# Patient Record
Sex: Female | Born: 1965 | Race: White | Hispanic: No | Marital: Married | Smoking: Never smoker
Health system: Southern US, Community
[De-identification: ages and names within clinical notes are randomized; demographics above are authoritative.]

## PROBLEM LIST (undated history)

## (undated) DIAGNOSIS — M797 Fibromyalgia: Secondary | ICD-10-CM

## (undated) DIAGNOSIS — I10 Essential (primary) hypertension: Secondary | ICD-10-CM

## (undated) DIAGNOSIS — Z87442 Personal history of urinary calculi: Secondary | ICD-10-CM

## (undated) DIAGNOSIS — K219 Gastro-esophageal reflux disease without esophagitis: Secondary | ICD-10-CM

## (undated) DIAGNOSIS — F419 Anxiety disorder, unspecified: Secondary | ICD-10-CM

## (undated) DIAGNOSIS — G43909 Migraine, unspecified, not intractable, without status migrainosus: Secondary | ICD-10-CM

## (undated) DIAGNOSIS — M1990 Unspecified osteoarthritis, unspecified site: Secondary | ICD-10-CM

## (undated) DIAGNOSIS — Z9889 Other specified postprocedural states: Secondary | ICD-10-CM

## (undated) DIAGNOSIS — M810 Age-related osteoporosis without current pathological fracture: Secondary | ICD-10-CM

## (undated) DIAGNOSIS — E2740 Unspecified adrenocortical insufficiency: Secondary | ICD-10-CM

## (undated) DIAGNOSIS — E7800 Pure hypercholesterolemia, unspecified: Secondary | ICD-10-CM

## (undated) HISTORY — PX: SHOULDER SURGERY: SHX246

## (undated) HISTORY — PX: CHOLECYSTECTOMY: SHX55

## (undated) HISTORY — PX: ENDOMETRIAL ABLATION: SHX621

## (undated) HISTORY — PX: KNEE SURGERY: SHX244

## (undated) HISTORY — PX: OTHER SURGICAL HISTORY: SHX169

## (undated) HISTORY — PX: REFRACTIVE SURGERY: SHX103

---

## 2021-05-02 ENCOUNTER — Encounter (HOSPITAL_BASED_OUTPATIENT_CLINIC_OR_DEPARTMENT_OTHER): Payer: Self-pay

## 2021-05-02 ENCOUNTER — Emergency Department (HOSPITAL_BASED_OUTPATIENT_CLINIC_OR_DEPARTMENT_OTHER)
Admission: EM | Admit: 2021-05-02 | Discharge: 2021-05-02 | Disposition: A | Discharge status: Home / Self Care | Payer: BC Managed Care – PPO | Attending: Emergency Medicine | Admitting: Emergency Medicine

## 2021-05-02 ENCOUNTER — Emergency Department (HOSPITAL_BASED_OUTPATIENT_CLINIC_OR_DEPARTMENT_OTHER): Payer: BC Managed Care – PPO

## 2021-05-02 ENCOUNTER — Other Ambulatory Visit: Payer: Self-pay

## 2021-05-02 DIAGNOSIS — I10 Essential (primary) hypertension: Secondary | ICD-10-CM | POA: Insufficient documentation

## 2021-05-02 DIAGNOSIS — E876 Hypokalemia: Secondary | ICD-10-CM | POA: Diagnosis not present

## 2021-05-02 DIAGNOSIS — R0789 Other chest pain: Secondary | ICD-10-CM | POA: Insufficient documentation

## 2021-05-02 HISTORY — DX: Age-related osteoporosis without current pathological fracture: M81.0

## 2021-05-02 HISTORY — DX: Migraine, unspecified, not intractable, without status migrainosus: G43.909

## 2021-05-02 HISTORY — DX: Essential (primary) hypertension: I10

## 2021-05-02 HISTORY — DX: Pure hypercholesterolemia, unspecified: E78.00

## 2021-05-02 HISTORY — DX: Fibromyalgia: M79.7

## 2021-05-02 LAB — CBC WITH DIFFERENTIAL/PLATELET
Abs Immature Granulocytes: 0.16 10*3/uL — ABNORMAL HIGH (ref 0.00–0.07)
Basophils Absolute: 0.1 10*3/uL (ref 0.0–0.1)
Basophils Relative: 0 %
Eosinophils Absolute: 0.1 10*3/uL (ref 0.0–0.5)
Eosinophils Relative: 0 %
HCT: 40.7 % (ref 36.0–46.0)
Hemoglobin: 13.4 g/dL (ref 12.0–15.0)
Immature Granulocytes: 1 %
Lymphocytes Relative: 19 %
Lymphs Abs: 3.5 10*3/uL (ref 0.7–4.0)
MCH: 29.8 pg (ref 26.0–34.0)
MCHC: 32.9 g/dL (ref 30.0–36.0)
MCV: 90.4 fL (ref 80.0–100.0)
Monocytes Absolute: 1 10*3/uL (ref 0.1–1.0)
Monocytes Relative: 6 %
Neutro Abs: 13.3 10*3/uL — ABNORMAL HIGH (ref 1.7–7.7)
Neutrophils Relative %: 74 %
Platelets: 405 10*3/uL — ABNORMAL HIGH (ref 150–400)
RBC: 4.5 MIL/uL (ref 3.87–5.11)
RDW: 15.6 % — ABNORMAL HIGH (ref 11.5–15.5)
WBC: 18.1 10*3/uL — ABNORMAL HIGH (ref 4.0–10.5)
nRBC: 0 % (ref 0.0–0.2)

## 2021-05-02 LAB — COMPREHENSIVE METABOLIC PANEL
ALT: 15 U/L (ref 0–44)
AST: 15 U/L (ref 15–41)
Albumin: 3.7 g/dL (ref 3.5–5.0)
Alkaline Phosphatase: 95 U/L (ref 38–126)
Anion gap: 10 (ref 5–15)
BUN: 11 mg/dL (ref 6–20)
CO2: 32 mmol/L (ref 22–32)
Calcium: 9.4 mg/dL (ref 8.9–10.3)
Chloride: 95 mmol/L — ABNORMAL LOW (ref 98–111)
Creatinine, Ser: 0.85 mg/dL (ref 0.44–1.00)
GFR, Estimated: >60 mL/min (ref >60)
Glucose, Bld: 167 mg/dL — ABNORMAL HIGH (ref 70–99)
Potassium: 3.1 mmol/L — ABNORMAL LOW (ref 3.5–5.1)
Sodium: 137 mmol/L (ref 135–145)
Total Bilirubin: 0.3 mg/dL (ref 0.3–1.2)
Total Protein: 7.2 g/dL (ref 6.5–8.1)

## 2021-05-02 LAB — LIPASE, BLOOD: Lipase: 30 U/L (ref 11–51)

## 2021-05-02 LAB — TROPONIN I (HIGH SENSITIVITY)
Troponin I (High Sensitivity): 2 ng/L (ref <18)
Troponin I (High Sensitivity): 2 ng/L (ref <18)

## 2021-05-02 MED ORDER — POTASSIUM CHLORIDE CRYS ER 20 MEQ PO TBCR
40.0000 meq | EXTENDED_RELEASE_TABLET | Freq: Once | ORAL | Status: AC
  Administered 2021-05-02: 40 meq via ORAL
  Filled 2021-05-02: qty 2

## 2021-05-02 MED ORDER — POTASSIUM CHLORIDE ER 20 MEQ PO TBCR: 20.0000 meq | EXTENDED_RELEASE_TABLET | Freq: Every day | ORAL | 0 refills | Status: DC

## 2021-05-02 NOTE — ED Provider Notes (Signed)
[facility] EMERGENCY DEPARTMENT Provider Note   CSN: [id number] Arrival date & time: 05/02/21  1336     History Chief Complaint  Patient presents with  . Chest Pain    Tracey Clarke is a 55 y.o. female with a past medical history of hypertension, high cholesterol, migraines, fibromyalgia, adrenal insufficiency, who presents today for evaluation of chest pain. She states that since yesterday at about noon she has had 3 episodes of sharp bad chest pain with a constant chest pressure since then. She states that the sharp bed chest pain that she has has started all 3 times while she was seated at her desk, she denies any physical exertion or strong emotions prior to this. She states that her pain lasts about 5 to 10 minutes.  No aggravating or alleviating factors noted.  She feels short of breath during this pain.  She denies any diaphoresis, nausea, or vomiting. She did not eat right before this pain started at. She denies any recent surgeries or immobilizations.  No hemoptysis. She denies any history of DVT/PE. She does report that her father had his first MI at the age of 54 and died in his 30s from heart disease, and that he had about 7 siblings and all of them have had some sort of heart issue. She denies any tobacco use.  No fevers, recent sick contacts.  The first episode of chest pain that she had started in her chest and radiated into her back.  The second 1 started in her back and then radiated into her chest.  HPI     Past Medical History:  Diagnosis Date  . Fibromyalgia   . High cholesterol   . Hypertension   . Migraine   . Osteoporosis     There are no problems to display for this patient.   Past Surgical History:  Procedure Laterality Date  . ENDOMETRIAL ABLATION    . KNEE SURGERY    . SHOULDER SURGERY       OB History   No obstetric history on file.     History reviewed. No pertinent family history.  Social History   Tobacco Use  .  Smoking status: Never Smoker  . Smokeless tobacco: Never Used  Vaping Use  . Vaping Use: Never used  Substance Use Topics  . Alcohol use: Never  . Drug use: Never    Home Medications Prior to Admission medications   Medication Sig Start Date End Date Taking? Authorizing Provider  potassium chloride 20 MEQ TBCR Take 20 mEq by mouth daily for 5 days. 05/02/21 05/07/21 Yes Cristina Gong, PA-C    Allergies    Codeine, Doxycycline, and Lyrica [pregabalin]  Review of Systems   Review of Systems  Constitutional: Negative for chills, fatigue and fever.  HENT: Negative for congestion.   Eyes: Negative for visual disturbance.  Respiratory: Positive for shortness of breath (When pain is severe).   Cardiovascular: Positive for chest pain. Negative for palpitations and leg swelling.  Gastrointestinal: Negative for abdominal pain, diarrhea, nausea and vomiting.  Genitourinary: Negative for dysuria.  Musculoskeletal: Positive for back pain (Also has chronic back pain).  Skin: Negative for color change, rash and wound.  Neurological: Positive for headaches (Has migraines). Negative for weakness.  Psychiatric/Behavioral: Negative for confusion.  All other systems reviewed and are negative.   Physical Exam Updated Vital Signs BP 139/68 (BP Location: Right Arm)   Pulse 74   Temp 98.4 F (36.9 C) (Oral)  Resp 16   Ht 5\' 3"  (1.6 m)   Wt 93.9 kg   SpO2 99%   BMI 36.67 kg/m   Physical Exam  ED Results / Procedures / Treatments   Labs (all labs ordered are listed, but only abnormal results are displayed) Labs Reviewed  COMPREHENSIVE METABOLIC PANEL - Abnormal; Notable for the following components:      Result Value   Potassium 3.1 (*)    Chloride 95 (*)    Glucose, Bld 167 (*)    All other components within normal limits  CBC WITH DIFFERENTIAL/PLATELET - Abnormal; Notable for the following components:   WBC 18.1 (*)    RDW 15.6 (*)    Platelets 405 (*)    Neutro Abs 13.3  (*)    Abs Immature Granulocytes 0.16 (*)    All other components within normal limits  LIPASE, BLOOD  TROPONIN I (HIGH SENSITIVITY)  TROPONIN I (HIGH SENSITIVITY)    EKG EKG Interpretation  Date/Time:  Friday May 02 2021 13:49:07 EDT Ventricular Rate:  73 PR Interval:  142 QRS Duration: 86 QT Interval:  416 QTC Calculation: 458 R Axis:   117 Text Interpretation: Normal sinus rhythm Left posterior fascicular block Abnormal ECG No previous ECGs available Confirmed by 12-08-1992 (Alvira Monday) on 05/02/2021 1:59:10 PM   Radiology DG Chest 2 View  Result Date: 05/02/2021 CLINICAL DATA:  Chest pain and shortness of breath beginning yesterday EXAM: CHEST - 2 VIEW COMPARISON:  08/20/2016 FINDINGS: Heart and mediastinal shadows are normal. The lungs are clear. The vascularity is normal. No effusions. Previous right shoulder replacement. Mild spinal degenerative changes. IMPRESSION: No active cardiopulmonary disease. Electronically Signed   By: 08/22/2016 M.D.   On: 05/02/2021 14:44    Procedures Procedures   Medications Ordered in ED Medications  potassium chloride SA (KLOR-CON) CR tablet 40 mEq (40 mEq Oral Given 05/02/21 1517)    ED Course  I have reviewed the triage vital signs and the nursing notes.  Pertinent labs & imaging results that were available during my care of the patient were reviewed by me and considered in my medical decision making (see chart for details).  Clinical Course as of 05/03/21 0049  Fri May 02, 2021  1412 I attempted to see patient, she is going to x-ray will return.  [EH]  1506 WBC(!): 18.1 Three months ago was in the 10s.  Patient takes steroids daily due to adrenal insufficiency, suspect this may be contributing.  [EH]  1511 Patient reevaluated, we discussed results. She did have a steroid shot on Tuesday which would explain the leukocytosis.  She does not have any other infectious symptoms. [EH]    Clinical Course User Index [EH] Friday   MDM Rules/Calculators/A&P                         Patient is a 55 year old woman who presents today for evaluation of 3 episodes of chest pain.  These occurred at rest, lasted 5 to 10 minutes.  They were not associated with paresis, nausea or vomiting.  Troponin x2 is negative.  Here patient is afebrile, not hypoxic, tachycardic or tachypneic.  Her white count is slightly elevated at 18, however she did get a steroid shot on Tuesday and takes chronic daily steroids for adrenal insufficiency.  Chest x-ray is unremarkable.  She does have mild hypokalemia.  I suspect this is secondary to her hydrochlorothiazide and Lasix use.  This is  replaced here, and she is given a prescription for 5 more days at home with recommendations to follow-up with PCP.  Her EKG was nonischemic. Patient's heart score is 4 putting her in the moderate risk category appropriate for either admission versus close outpatient follow-up. Discussed these options at length with the patient and her family member in room. Given 2 negative troponins, the ability to exacerbate and recreate her pain with chest palpation we discussed admission versus discharge with close outpatient follow-up.  We discussed risks, benefits, noncompliance to these decisions. Through shared decision making patient wished for close outpatient follow-up. She is given cardiology follow-up. We discussed that if her chest pain returns, she develops any new symptoms, or has any other concerns she should not hesitate to seek additional medical care and evaluation. She states her understanding and is agreeable with this plan.  Return precautions were discussed with patient who states their understanding.  At the time of discharge patient denied any unaddressed complaints or concerns.  Patient is agreeable for discharge home.  Note: Portions of this report may have been transcribed using voice recognition software. Every effort was made to ensure  accuracy; however, inadvertent computerized transcription errors may be present   Final Clinical Impression(s) / ED Diagnoses Final diagnoses:  Atypical chest pain  Chest wall pain  Hypokalemia    Rx / DC Orders ED Discharge Orders         Ordered    potassium chloride 20 MEQ TBCR  Daily        05/02/21 1724           Cristina Gong, Cordelia Poche 05/03/21 0052    Virgina Norfolk, DO 05/03/21 1824

## 2021-05-02 NOTE — ED Triage Notes (Addendum)
Pt c/o CP started yesterday-denies fever/flu sx-NAD-steady gait 

## 2021-05-02 NOTE — Discharge Instructions (Addendum)
As we discussed today your potassium was low. This is most likely due to your Lasix and hydrochlorothiazide. I have given you a prescription for a few days of potassium to help replete your potassium. I would recommend following up with your primary care doctor to get this rechecked after you finish this supplement.  Please schedule the appointment now.  It may be that you need to be on potassium every day to help keep your levels normal.  I suspect that at least some of your chest pain is related to pain in the chest wall as your chest wall hurt when I pressed on it.   If your chest pain returns, you have any shortness of breath, fevers, pain in your left-sided neck or arm that is abnormal, you develop vomiting, nausea, cold sweats, or have any other concerns please do not hesitate to seek additional medical care and evaluation.  Please schedule a follow-up appointment with cardiology.  Please call them on Monday to set up an appointment.  Today we discussed options for admission versus discharge home with outpatient follow-up. Through shared decision making after we discussed risks, benefits, and alternatives decided on close outpatient follow-up.

## 2022-03-24 ENCOUNTER — Other Ambulatory Visit (HOSPITAL_BASED_OUTPATIENT_CLINIC_OR_DEPARTMENT_OTHER): Payer: Self-pay

## 2022-03-24 MED ORDER — SOLU-CORTEF 100 MG IJ SOLR
INTRAMUSCULAR | 1 refills | Status: DC
  Filled 2022-03-24: qty 4, 2d supply, fill #0
  Filled 2022-03-24: qty 2, 1d supply, fill #0

## 2022-06-21 IMAGING — CR DG CHEST 2V
2 series · 2 of 2 positions shown · non-contrast
Comparison: 08/20/2016

CLINICAL DATA: Chest pain and shortness of breath beginning
yesterday

EXAM:
CHEST - 2 VIEW

[w chest pa]
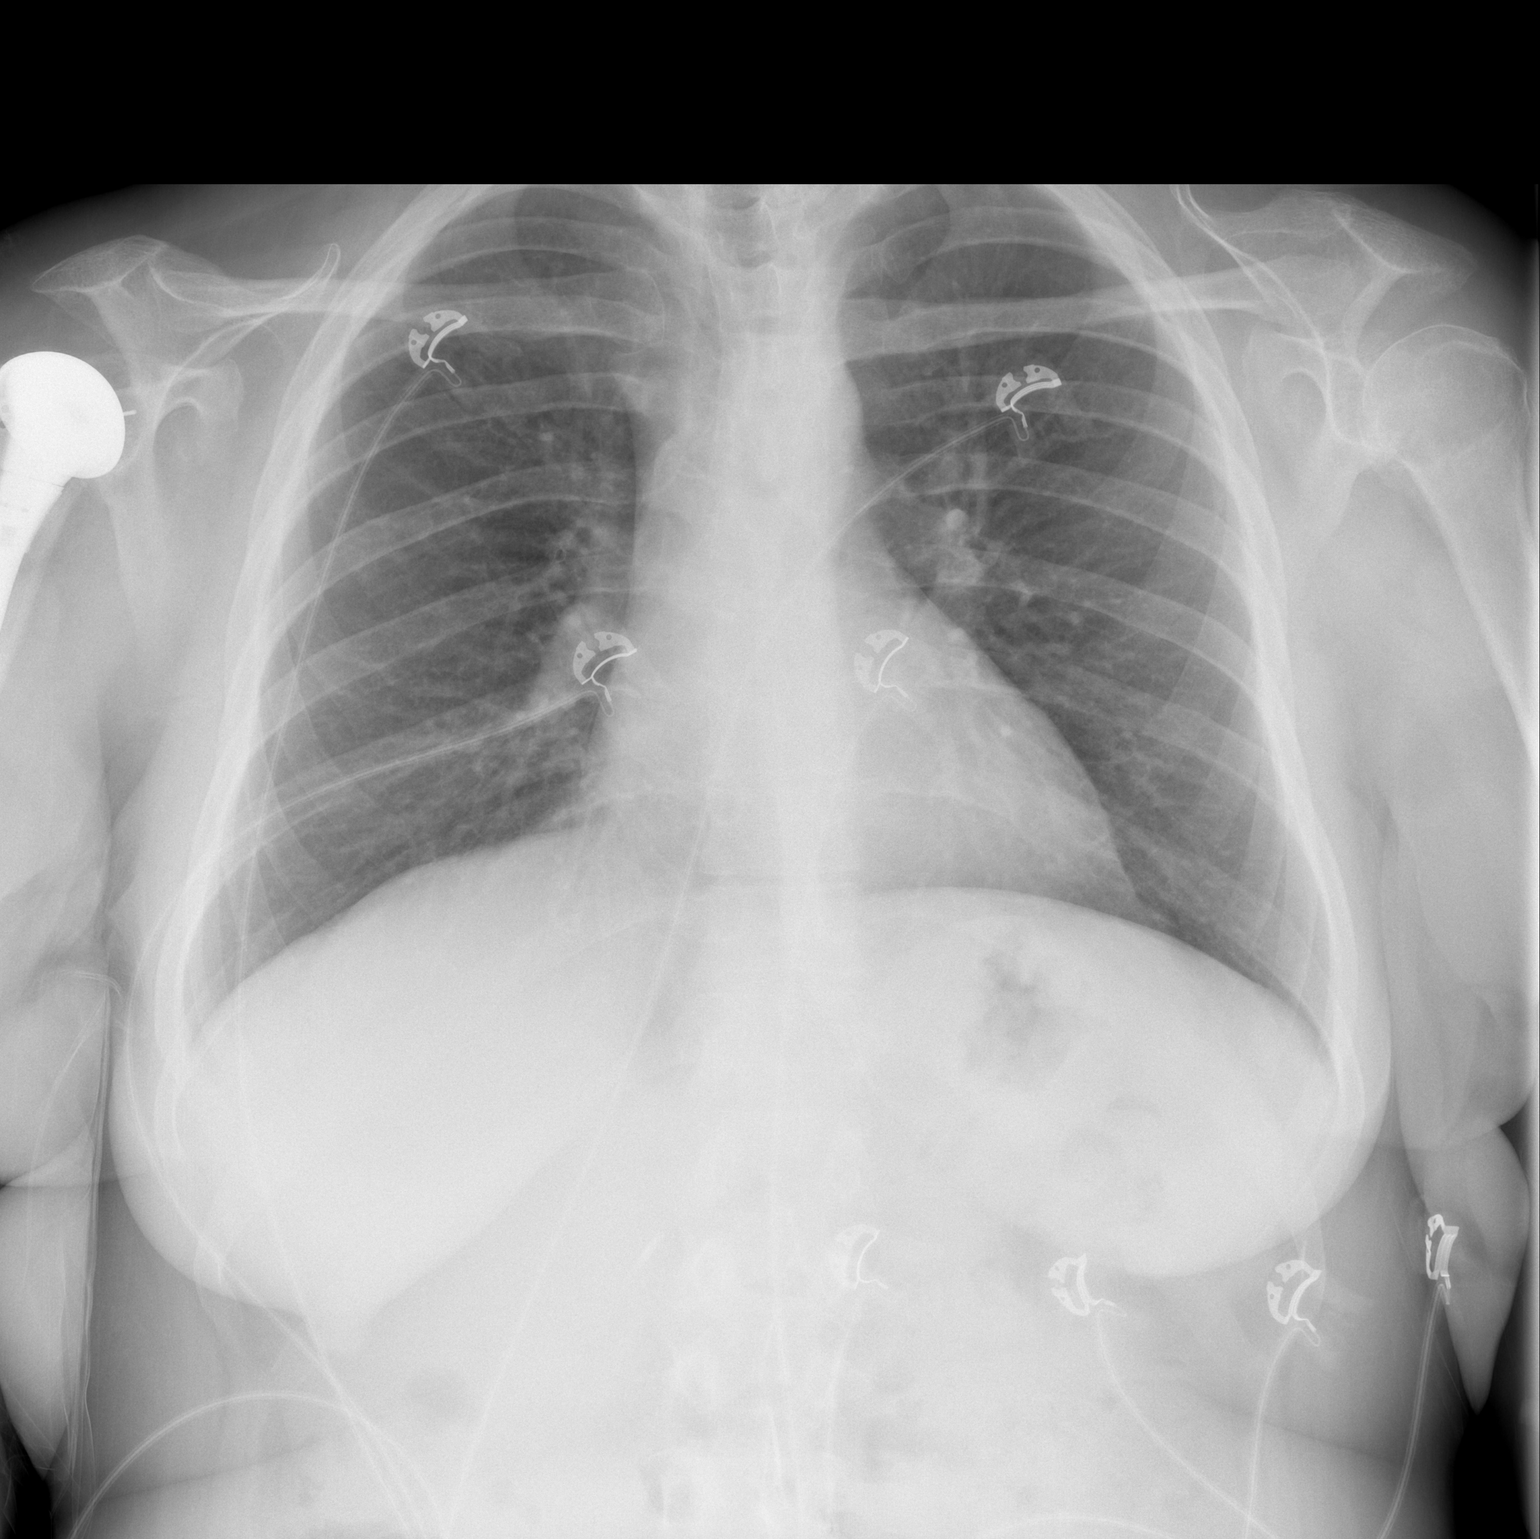

[w chest lat]
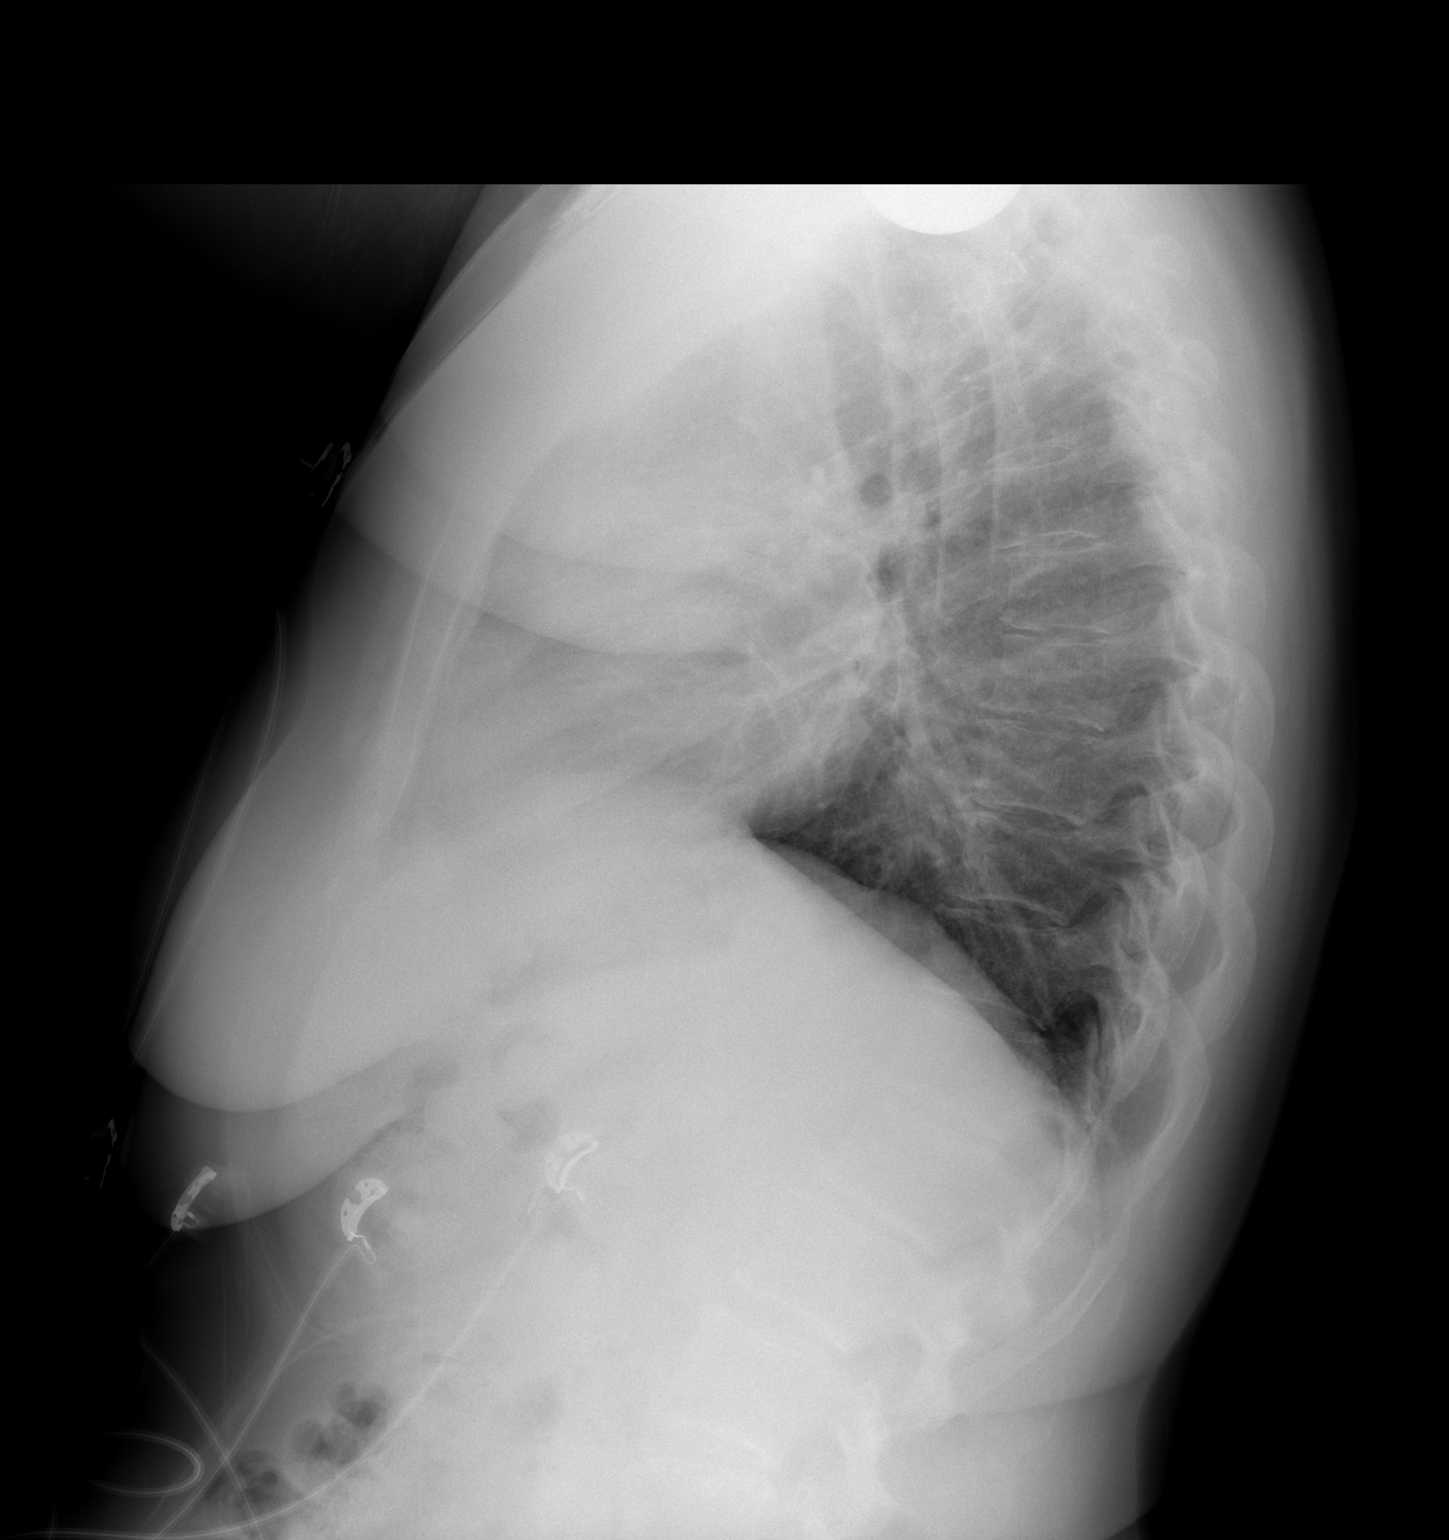

[2 of 2 positions shown; findings below may reference images not displayed]

FINDINGS: Heart and mediastinal shadows are normal. The lungs are clear. The
vascularity is normal. No effusions. Previous right shoulder
replacement. Mild spinal degenerative changes.
IMPRESSION: No active cardiopulmonary disease.

## 2023-07-23 ENCOUNTER — Other Ambulatory Visit (HOSPITAL_COMMUNITY): Payer: Self-pay | Admitting: Orthopedic Surgery

## 2023-07-23 DIAGNOSIS — Z96612 Presence of left artificial shoulder joint: Secondary | ICD-10-CM

## 2023-07-28 ENCOUNTER — Encounter (HOSPITAL_COMMUNITY)
Admission: RE | Admit: 2023-07-28 | Discharge: 2023-07-28 | Disposition: A | Discharge status: Home / Self Care | Payer: BC Managed Care – PPO | Source: Ambulatory Visit | Attending: Orthopedic Surgery | Admitting: Orthopedic Surgery

## 2023-07-28 DIAGNOSIS — Z96612 Presence of left artificial shoulder joint: Secondary | ICD-10-CM | POA: Insufficient documentation

## 2023-07-28 MED ORDER — TECHNETIUM TC 99M MEDRONATE IV KIT
21.0000 | PACK | Freq: Once | INTRAVENOUS | Status: AC | PRN
  Administered 2023-07-28: 21 via INTRAVENOUS

## 2023-08-17 NOTE — Progress Notes (Signed)
Surgery orders requested via Epic inbox.

## 2023-08-19 NOTE — Progress Notes (Addendum)
Anesthesia Review:  PCP: [facility]- LOV 05/12/23  Cardiologist :  DR Uvaldo Rising Timsina LOV 07/19/23  Endo- DR Mayford Knife- 05/17/2023  Neuro- DR Antonietta Barcelona- LOV 07/13/23  Chest x-ray : EKG : [facility]/24[facility] - have requested by fax  On chart  Followed by cardiologist due to father deceased at age 57 due to heart  Echo : Stress test: Cardiac Cath :  Activity level: can do a flgiht of stairs without difficulty  Sleep Study/ CPAP : none Fasting Blood Sugar :      / Checks Blood Sugar -- times a day:   Blood Thinner/ Instructions /Last Dose: ASA / Instructions/ Last Dose :   81 mg aspiirin    Ozempic - last dose on 08/19/2023 - takes for weight loss    PT has adrenal fatigue ( adrenal insufficiency)  per pt .  Endocrinologist- DR Rueben Bash - per pt at preop MD would like for pt to receive preop, periop and post hydrocortison IV per pt.  Instructed pt to have DR Mayford Knife send to Dr Rennis Chris their recommendations for Hydrocortisone and we would receive those instructions.  PT voiced understanding.  Husband with pt at time of preop appt.  Instructed pt to take po cortef am of surgery. Jacobo Forest aware.

## 2023-08-19 NOTE — Patient Instructions (Signed)
SURGICAL WAITING ROOM VISITATION  Patients having surgery or a procedure may have no more than 2 support people in the waiting area - these visitors may rotate.    Children under the age of 5 must have an adult with them who is not the patient.  Due to an increase in RSV and influenza rates and associated hospitalizations, children ages 50 and under may not visit patients in [facility] hospitals.  If the patient needs to stay at the hospital during part of their recovery, the visitor guidelines for inpatient rooms apply. Pre-op nurse will coordinate an appropriate time for 1 support person to accompany patient in pre-op.  This support person may not rotate.    Please refer to the [facility] website for the visitor guidelines for Inpatients (after your surgery is over and you are in a regular room).       Your procedure is scheduled on:  09/02/2023    Report to [facility] Main Entrance    Report to admitting at   [phone]   Call this number if you have problems the morning of surgery [phone]   Do not eat food :After Midnight.   After Midnight you may have the following liquids until _ 0430_____ AM DAY OF SURGERY  Water Non-Citrus Juices (without pulp, NO RED-Apple, White grape, White cranberry) Black Coffee (NO MILK/CREAM OR CREAMERS, sugar ok)  Clear Tea (NO MILK/CREAM OR CREAMERS, sugar ok) regular and decaf                             Plain Jell-O (NO RED)                                           Fruit ices (not with fruit pulp, NO RED)                                     Popsicles (NO RED)                                                               Sports drinks like Gatorade (NO RED)                   The day of surgery:  Drink ONE (1) Pre-Surgery Clear Ensure or G2 at  0430 AM  ( have completed by )  the morning of surgery. Drink in one sitting. Do not sip.  This drink was given to you during your hospital  pre-op appointment visit. Nothing else to  drink after completing the  Pre-Surgery Clear Ensure or G2.          If you have questions, please contact your surgeon's office.      Oral Hygiene is also important to reduce your risk of infection.                                    Remember - BRUSH YOUR TEETH THE MORNING OF SURGERY WITH YOUR  REGULAR TOOTHPASTE  DENTURES WILL BE REMOVED PRIOR TO SURGERY PLEASE DO NOT APPLY "Poly grip" OR ADHESIVES!!!   Do NOT smoke after Midnight   Stop all vitamins and herbal supplements 7 days before surgery.   Take these medicines the morning of surgery with A SIP OF WATER: gabapentin, omeprazole, zoloft   DO NOT TAKE ANY ORAL DIABETIC MEDICATIONS DAY OF YOUR SURGERY  Bring CPAP mask and tubing day of surgery.                              You may not have any metal on your body including hair pins, jewelry, and body piercing             Do not wear make-up, lotions, powders, perfumes/cologne, or deodorant  Do not wear nail polish including gel and S&S, artificial/acrylic nails, or any other type of covering on natural nails including finger and toenails. If you have artificial nails, gel coating, etc. that needs to be removed by a nail salon please have this removed prior to surgery or surgery may need to be canceled/ delayed if the surgeon/ anesthesia feels like they are unable to be safely monitored.   Do not shave  48 hours prior to surgery.               Men may shave face and neck.   Do not bring valuables to the hospital. [facility] IS NOT             RESPONSIBLE   FOR VALUABLES.   Contacts, glasses, dentures or bridgework may not be worn into surgery.   Bring small overnight bag day of surgery.   DO NOT BRING YOUR HOME MEDICATIONS TO THE HOSPITAL. PHARMACY WILL DISPENSE MEDICATIONS LISTED ON YOUR MEDICATION LIST TO YOU DURING YOUR ADMISSION IN THE HOSPITAL!    Patients discharged on the day of surgery will not be allowed to drive home.  Someone NEEDS to stay with you for the  first 24 hours after anesthesia.   Special Instructions: Bring a copy of your healthcare power of attorney and living will documents the day of surgery if you haven't scanned them before.              Please read over the following fact sheets you were given: IF YOU HAVE QUESTIONS ABOUT YOUR PRE-OP INSTRUCTIONS PLEASE CALL [phone]   If you received a COVID test during your pre-op visit  it is requested that you wear a mask when out in public, stay away from anyone that may not be feeling well and notify your surgeon if you develop symptoms. If you test positive for Covid or have been in contact with anyone that has tested positive in the last 10 days please notify you surgeon.      Pre-operative 5 CHG Bath Instructions   You can play a key role in reducing the risk of infection after surgery. Your skin needs to be as free of germs as possible. You can reduce the number of germs on your skin by washing with CHG (chlorhexidine gluconate) soap before surgery. CHG is an antiseptic soap that kills germs and continues to kill germs even after washing.   DO NOT use if you have an allergy to chlorhexidine/CHG or antibacterial soaps. If your skin becomes reddened or irritated, stop using the CHG and notify one of our RNs at [phone].   Please shower with the CHG soap starting  4 days before surgery using the following schedule:     Please keep in mind the following:  DO NOT shave, including legs and underarms, starting the day of your first shower.   You may shave your face at any point before/day of surgery.  Place clean sheets on your bed the day you start using CHG soap. Use a clean washcloth (not used since being washed) for each shower. DO NOT sleep with pets once you start using the CHG.   CHG Shower Instructions:  If you choose to wash your hair and private area, wash first with your normal shampoo/soap.  After you use shampoo/soap, rinse your hair and body thoroughly to remove  shampoo/soap residue.  Turn the water OFF and apply about 3 tablespoons (45 ml) of CHG soap to a CLEAN washcloth.  Apply CHG soap ONLY FROM YOUR NECK DOWN TO YOUR TOES (washing for 3-5 minutes)  DO NOT use CHG soap on face, private areas, open wounds, or sores.  Pay special attention to the area where your surgery is being performed.  If you are having back surgery, having someone wash your back for you may be helpful. Wait 2 minutes after CHG soap is applied, then you may rinse off the CHG soap.  Pat dry with a clean towel  Put on clean clothes/pajamas   If you choose to wear lotion, please use ONLY the CHG-compatible lotions on the back of this paper.     Additional instructions for the day of surgery: DO NOT APPLY any lotions, deodorants, cologne, or perfumes.   Put on clean/comfortable clothes.  Brush your teeth.  Ask your nurse before applying any prescription medications to the skin.      CHG Compatible Lotions   Aveeno Moisturizing lotion  Cetaphil Moisturizing Cream  Cetaphil Moisturizing Lotion  Clairol Herbal Essence Moisturizing Lotion, Dry Skin  Clairol Herbal Essence Moisturizing Lotion, Extra Dry Skin  Clairol Herbal Essence Moisturizing Lotion, Normal Skin  Curel Age Defying Therapeutic Moisturizing Lotion with Alpha Hydroxy  Curel Extreme Care Body Lotion  Curel Soothing Hands Moisturizing Hand Lotion  Curel Therapeutic Moisturizing Cream, Fragrance-Free  Curel Therapeutic Moisturizing Lotion, Fragrance-Free  Curel Therapeutic Moisturizing Lotion, Original Formula  Eucerin Daily Replenishing Lotion  Eucerin Dry Skin Therapy Plus Alpha Hydroxy Crme  Eucerin Dry Skin Therapy Plus Alpha Hydroxy Lotion  Eucerin Original Crme  Eucerin Original Lotion  Eucerin Plus Crme Eucerin Plus Lotion  Eucerin TriLipid Replenishing Lotion  Keri Anti-Bacterial Hand Lotion  Keri Deep Conditioning Original Lotion Dry Skin Formula Softly Scented  Keri Deep Conditioning  Original Lotion, Fragrance Free Sensitive Skin Formula  Keri Lotion Fast Absorbing Fragrance Free Sensitive Skin Formula  Keri Lotion Fast Absorbing Softly Scented Dry Skin Formula  Keri Original Lotion  Keri Skin Renewal Lotion Keri Silky Smooth Lotion  Keri Silky Smooth Sensitive Skin Lotion  Nivea Body Creamy Conditioning Oil  Nivea Body Extra Enriched Lotion  Nivea Body Original Lotion  Nivea Body Sheer Moisturizing Lotion Nivea Crme  Nivea Skin Firming Lotion  NutraDerm 30 Skin Lotion  NutraDerm Skin Lotion  NutraDerm Therapeutic Skin Cream  NutraDerm Therapeutic Skin Lotion  ProShield Protective Hand Cream  Provon moisturizing lotion  [place]- Preparing for Total Shoulder Arthroplasty    Before surgery, you can play an important role. Because skin is not sterile, your skin needs to be as free of germs as possible. You can reduce the number of germs on your skin by using the following products. Benzoyl Peroxide Gel  Reduces the number of germs present on the skin Applied twice a day to shoulder area starting two days before surgery    ==================================================================  Please follow these instructions carefully:  BENZOYL PEROXIDE 5% GEL  Please do not use if you have an allergy to benzoyl peroxide.   If your skin becomes reddened/irritated stop using the benzoyl peroxide.  Starting two days before surgery, apply as follows: Apply benzoyl peroxide in the morning and at night. Apply after taking a shower. If you are not taking a shower clean entire shoulder front, back, and side along with the armpit with a clean wet washcloth.  Place a quarter-sized dollop on your shoulder and rub in thoroughly, making sure to cover the front, back, and side of your shoulder, along with the armpit.   2 days before ____ AM   ____ PM              1 day before ____ AM   ____ PM                         Do this twice a day for two days.  (Last application is  the night before surgery, AFTER using the CHG soap as described below).  Do NOT apply benzoyl peroxide gel on the day of surgery.

## 2023-08-24 ENCOUNTER — Encounter (HOSPITAL_COMMUNITY)
Admission: RE | Admit: 2023-08-24 | Discharge: 2023-08-24 | Disposition: A | Discharge status: Home / Self Care | Payer: BC Managed Care – PPO | Source: Ambulatory Visit | Attending: Orthopedic Surgery | Admitting: Orthopedic Surgery

## 2023-08-24 ENCOUNTER — Other Ambulatory Visit: Payer: Self-pay

## 2023-08-24 ENCOUNTER — Encounter (HOSPITAL_COMMUNITY): Payer: Self-pay

## 2023-08-24 VITALS — BP 131/82 | HR 62 | Temp 98.5°F | Resp 16 | Ht 63.0 in | Wt 172.0 lb

## 2023-08-24 DIAGNOSIS — Z01818 Encounter for other preprocedural examination: Secondary | ICD-10-CM | POA: Diagnosis present

## 2023-08-24 HISTORY — DX: Unspecified osteoarthritis, unspecified site: M19.90

## 2023-08-24 HISTORY — DX: Gastro-esophageal reflux disease without esophagitis: K21.9

## 2023-08-24 HISTORY — DX: Unspecified adrenocortical insufficiency: E27.40

## 2023-08-24 HISTORY — DX: Other specified postprocedural states: Z98.890

## 2023-08-24 HISTORY — DX: Anxiety disorder, unspecified: F41.9

## 2023-08-24 HISTORY — DX: Personal history of urinary calculi: Z87.442

## 2023-08-24 LAB — BASIC METABOLIC PANEL
Anion gap: 10 (ref 5–15)
BUN: 7 mg/dL (ref 6–20)
CO2: 27 mmol/L (ref 22–32)
Calcium: 9.8 mg/dL (ref 8.9–10.3)
Chloride: 96 mmol/L — ABNORMAL LOW (ref 98–111)
Creatinine, Ser: 0.76 mg/dL (ref 0.44–1.00)
GFR, Estimated: >60 mL/min (ref >60)
Glucose, Bld: 85 mg/dL (ref 70–99)
Potassium: 3.5 mmol/L (ref 3.5–5.1)
Sodium: 133 mmol/L — ABNORMAL LOW (ref 135–145)

## 2023-08-24 LAB — CBC
HCT: 42 % (ref 36.0–46.0)
Hemoglobin: 13.9 g/dL (ref 12.0–15.0)
MCH: 30.3 pg (ref 26.0–34.0)
MCHC: 33.1 g/dL (ref 30.0–36.0)
MCV: 91.5 fL (ref 80.0–100.0)
Platelets: 285 10*3/uL (ref 150–400)
RBC: 4.59 MIL/uL (ref 3.87–5.11)
RDW: 13.9 % (ref 11.5–15.5)
WBC: 9.6 10*3/uL (ref 4.0–10.5)
nRBC: 0 % (ref 0.0–0.2)

## 2023-08-24 LAB — SURGICAL PCR SCREEN
MRSA, PCR: NEGATIVE
Staphylococcus aureus: NEGATIVE

## 2023-08-25 NOTE — Progress Notes (Addendum)
Anesthesia Chart Review   Case: [id number] Date/Time: 09/02/23 0715   Procedure: Removal Left Anatomic shoulder arthroplasty and conversion to Left Reverse shoulder arthroplasty (Left: Shoulder)   Anesthesia type: General   Pre-op diagnosis: Unstable Left total shoulder arthroplasty   Location: [facility] / [facility]   Surgeons: Tracey Hanly, MD       DISCUSSION:57 y.o. never smoker with h/o PONV, adrenal insufficiency, unstable left shoulder arthroplasty scheduled for above procedure 09/02/2023 with Dr. Francena Clarke.   Pt with adrenal insufficiency.  Pt takes hydrocortisone 10mg  BID. Stable at last endocrinology visit 05/17/2023. Spoke with endocrine, recommendations outlined below.    Pt advised to double her hydrocortisone dose the day before surgery, hold DOS, and resume 10mg  BID three days after surgery.    Recommend 50mg  IV hydrocortisone intra-op.     Pt last seen by cardiology 07/19/2023. Per OV note echo ordered.  Pending results.   Low risk stress test 01/10/2016.   Echo 08/31/2023 with EF 60-65%, no valvular problems.  Results on chart.  VS: BP 131/82   Pulse 62   Temp 36.9 C (Oral)   Resp 16   Ht 5\' 3"  (1.6 m)   Wt 78 kg   SpO2 100%   BMI 30.47 kg/m   PROVIDERS: [employer], NP   LABS: Labs reviewed: Acceptable for surgery. (all labs ordered are listed, but only abnormal results are displayed)  Labs Reviewed  BASIC METABOLIC PANEL - Abnormal; Notable for the following components:      Result Value   Sodium 133 (*)    Chloride 96 (*)    All other components within normal limits  SURGICAL PCR SCREEN  CBC     IMAGES:   EKG:   CV:  Past Medical History:  Diagnosis Date   Adrenal insufficiency (HCC)    adrenal fatigue per pt   Anxiety    Arthritis    Fibromyalgia    GERD (gastroesophageal reflux disease)    High cholesterol    History of kidney stones    Hypertension    no meds in 10 years per pt   Migraine    Osteoporosis    PONV  (postoperative nausea and vomiting)     Past Surgical History:  Procedure Laterality Date   burr hole surgery on brain      CHOLECYSTECTOMY     ENDOMETRIAL ABLATION     KNEE SURGERY     REFRACTIVE SURGERY     right wrist arthroscopic surgery      SHOULDER SURGERY     umbilical hernia surgery       MEDICATIONS:  acetaminophen (TYLENOL) 650 MG CR tablet   ARIPiprazole (ABILIFY) 5 MG tablet   Ascorbic Acid (VITAMIN C) 1000 MG tablet   aspirin EC 81 MG tablet   atenolol (TENORMIN) 50 MG tablet   Cholecalciferol (VITAMIN D3) 125 MCG (5000 UT) CAPS   Coenzyme Q10 (COQ10) 100 MG CAPS   diphenhydramine-acetaminophen (TYLENOL PM) 25-500 MG TABS tablet   DULoxetine (CYMBALTA) 60 MG capsule   furosemide (LASIX) 20 MG tablet   gabapentin (NEURONTIN) 800 MG tablet   hydrochlorothiazide (HYDRODIURIL) 12.5 MG tablet   hydrocortisone (CORTEF) 10 MG tablet   LIPITOR 80 MG tablet   LORazepam (ATIVAN) 0.5 MG tablet   meloxicam (MOBIC) 15 MG tablet   omeprazole (PRILOSEC) 40 MG capsule   OZEMPIC, 1 MG/DOSE, 4 MG/3ML SOPN   QULIPTA 60 MG TABS   sertraline (ZOLOFT) 100 MG tablet  tiZANidine (ZANAFLEX) 4 MG tablet   traZODone (DESYREL) 50 MG tablet   ZINC PICOLINATE PO   zolpidem (AMBIEN) 10 MG tablet   No current facility-administered medications for this encounter.     Tracey Cipro Ward, PA-C [facility] [phone]

## 2023-09-01 NOTE — Anesthesia Preprocedure Evaluation (Addendum)
Anesthesia Evaluation  Patient identified by MRN, date of birth, ID band Patient awake    Reviewed: Allergy & Precautions, NPO status , Patient's Chart, lab work & pertinent test results, reviewed documented beta blocker date and time   History of Anesthesia Complications Negative for: history of anesthetic complications  Airway Mallampati: II  TM Distance: >3 FB Neck ROM: Full    Dental no notable dental hx.    Pulmonary neg pulmonary ROS   Pulmonary exam normal        Cardiovascular hypertension, Pt. on medications and Pt. on home beta blockers Normal cardiovascular exam     Neuro/Psych  Headaches  Anxiety        GI/Hepatic Neg liver ROS,GERD  Controlled,,  Endo/Other  Adrenal insufficiency, takes hydrocortisone 10mg  BID  Renal/GU negative Renal ROS  negative genitourinary   Musculoskeletal  (+) Arthritis ,  Fibromyalgia -  Abdominal   Peds  Hematology negative hematology ROS (+)   Anesthesia Other Findings Day of surgery medications reviewed with patient.  Reproductive/Obstetrics                              Anesthesia Physical Anesthesia Plan  ASA: 3  Anesthesia Plan: General   Post-op Pain Management: Regional block* and Tylenol PO (pre-op)*   Induction: Intravenous  PONV Risk Score and Plan: 3 and Treatment may vary due to age or medical condition, Ondansetron, Midazolam and Dexamethasone  Airway Management Planned: Oral ETT  Additional Equipment: None  Intra-op Plan:   Post-operative Plan: Extubation in OR  Informed Consent: I have reviewed the patients History and Physical, chart, labs and discussed the procedure including the risks, benefits and alternatives for the proposed anesthesia with the patient or authorized representative who has indicated his/her understanding and acceptance.     Dental advisory given  Plan Discussed with: CRNA  Anesthesia Plan  Comments: (See PAT note 08/24/2023)        Anesthesia Quick Evaluation

## 2023-09-02 ENCOUNTER — Encounter (HOSPITAL_COMMUNITY)
Admission: RE | Disposition: A | Discharge status: Home / Self Care | Payer: Self-pay | Source: Ambulatory Visit | Attending: Orthopedic Surgery

## 2023-09-02 ENCOUNTER — Other Ambulatory Visit: Payer: Self-pay

## 2023-09-02 ENCOUNTER — Ambulatory Visit (HOSPITAL_COMMUNITY): Payer: BC Managed Care – PPO | Admitting: Physician Assistant

## 2023-09-02 ENCOUNTER — Ambulatory Visit (HOSPITAL_COMMUNITY)
Admission: RE | Admit: 2023-09-02 | Discharge: 2023-09-02 | Disposition: A | Discharge status: Home / Self Care | Payer: BC Managed Care – PPO | Source: Ambulatory Visit | Attending: Orthopedic Surgery | Admitting: Orthopedic Surgery

## 2023-09-02 ENCOUNTER — Ambulatory Visit (HOSPITAL_COMMUNITY): Payer: Self-pay | Admitting: Anesthesiology

## 2023-09-02 ENCOUNTER — Encounter (HOSPITAL_COMMUNITY): Payer: Self-pay | Admitting: Orthopedic Surgery

## 2023-09-02 DIAGNOSIS — K219 Gastro-esophageal reflux disease without esophagitis: Secondary | ICD-10-CM | POA: Diagnosis not present

## 2023-09-02 DIAGNOSIS — F419 Anxiety disorder, unspecified: Secondary | ICD-10-CM | POA: Diagnosis not present

## 2023-09-02 DIAGNOSIS — T84028A Dislocation of other internal joint prosthesis, initial encounter: Secondary | ICD-10-CM | POA: Diagnosis present

## 2023-09-02 DIAGNOSIS — I10 Essential (primary) hypertension: Secondary | ICD-10-CM | POA: Insufficient documentation

## 2023-09-02 DIAGNOSIS — Y831 Surgical operation with implant of artificial internal device as the cause of abnormal reaction of the patient, or of later complication, without mention of misadventure at the time of the procedure: Secondary | ICD-10-CM | POA: Diagnosis not present

## 2023-09-02 DIAGNOSIS — Z79899 Other long term (current) drug therapy: Secondary | ICD-10-CM | POA: Diagnosis not present

## 2023-09-02 DIAGNOSIS — M797 Fibromyalgia: Secondary | ICD-10-CM | POA: Diagnosis not present

## 2023-09-02 DIAGNOSIS — M1990 Unspecified osteoarthritis, unspecified site: Secondary | ICD-10-CM | POA: Diagnosis not present

## 2023-09-02 DIAGNOSIS — Z01818 Encounter for other preprocedural examination: Secondary | ICD-10-CM

## 2023-09-02 DIAGNOSIS — G43909 Migraine, unspecified, not intractable, without status migrainosus: Secondary | ICD-10-CM | POA: Insufficient documentation

## 2023-09-02 DIAGNOSIS — E2740 Unspecified adrenocortical insufficiency: Secondary | ICD-10-CM | POA: Insufficient documentation

## 2023-09-02 HISTORY — PX: TOTAL SHOULDER REVISION: SHX6130

## 2023-09-02 SURGERY — REVISION, TOTAL ARTHROPLASTY, SHOULDER
Anesthesia: General | Site: Shoulder | Laterality: Left

## 2023-09-02 MED ORDER — PROPOFOL 10 MG/ML IV BOLUS
INTRAVENOUS | Status: DC | PRN
  Administered 2023-09-02: 170 mg via INTRAVENOUS

## 2023-09-02 MED ORDER — FENTANYL CITRATE PF 50 MCG/ML IJ SOSY: 25.0000 ug | PREFILLED_SYRINGE | INTRAMUSCULAR | Status: DC | PRN

## 2023-09-02 MED ORDER — PROPOFOL 10 MG/ML IV BOLUS
INTRAVENOUS | Status: AC
  Filled 2023-09-02: qty 20

## 2023-09-02 MED ORDER — ROCURONIUM BROMIDE 10 MG/ML (PF) SYRINGE
PREFILLED_SYRINGE | INTRAVENOUS | Status: AC
  Filled 2023-09-02: qty 10

## 2023-09-02 MED ORDER — MIDAZOLAM HCL 2 MG/2ML IJ SOLN
INTRAMUSCULAR | Status: AC
  Filled 2023-09-02: qty 2

## 2023-09-02 MED ORDER — OXYCODONE HCL 5 MG PO TABS: 5.0000 mg | ORAL_TABLET | Freq: Once | ORAL | Status: DC | PRN

## 2023-09-02 MED ORDER — LIDOCAINE HCL (PF) 2 % IJ SOLN
INTRAMUSCULAR | Status: AC
  Filled 2023-09-02: qty 5

## 2023-09-02 MED ORDER — ONDANSETRON HCL 4 MG/2ML IJ SOLN
INTRAMUSCULAR | Status: AC
  Filled 2023-09-02: qty 2

## 2023-09-02 MED ORDER — PHENYLEPHRINE HCL (PRESSORS) 10 MG/ML IV SOLN
INTRAVENOUS | Status: AC
  Filled 2023-09-02: qty 1

## 2023-09-02 MED ORDER — ONDANSETRON HCL 4 MG PO TABS: 4.0000 mg | ORAL_TABLET | Freq: Three times a day (TID) | ORAL | 0 refills | Status: AC | PRN

## 2023-09-02 MED ORDER — PHENYLEPHRINE HCL (PRESSORS) 10 MG/ML IV SOLN
INTRAVENOUS | Status: DC | PRN
Start: 2023-09-02 — End: 2023-09-02
  Administered 2023-09-02: 80 ug via INTRAVENOUS
  Administered 2023-09-02: 50 ug via INTRAVENOUS

## 2023-09-02 MED ORDER — CHLORHEXIDINE GLUCONATE 0.12 % MT SOLN
15.0000 mL | Freq: Once | OROMUCOSAL | Status: AC
  Administered 2023-09-02: 15 mL via OROMUCOSAL

## 2023-09-02 MED ORDER — ROCURONIUM BROMIDE 100 MG/10ML IV SOLN
INTRAVENOUS | Status: DC | PRN
  Administered 2023-09-02: 60 mg via INTRAVENOUS

## 2023-09-02 MED ORDER — TRANEXAMIC ACID 1000 MG/10ML IV SOLN: 1000.0000 mg | INTRAVENOUS | Status: DC

## 2023-09-02 MED ORDER — VANCOMYCIN HCL 1000 MG IV SOLR
INTRAVENOUS | Status: DC | PRN
  Administered 2023-09-02: 1000 mg

## 2023-09-02 MED ORDER — DROPERIDOL 2.5 MG/ML IJ SOLN: 0.6250 mg | Freq: Once | INTRAMUSCULAR | Status: DC | PRN

## 2023-09-02 MED ORDER — DEXMEDETOMIDINE HCL IN NACL 80 MCG/20ML IV SOLN
INTRAVENOUS | Status: DC | PRN
  Administered 2023-09-02: 4 ug via INTRAVENOUS

## 2023-09-02 MED ORDER — CEFAZOLIN SODIUM-DEXTROSE 2-4 GM/100ML-% IV SOLN
2.0000 g | INTRAVENOUS | Status: AC
  Administered 2023-09-02: 2 g via INTRAVENOUS
  Filled 2023-09-02: qty 100

## 2023-09-02 MED ORDER — VANCOMYCIN HCL 1000 MG IV SOLR
INTRAVENOUS | Status: AC
  Filled 2023-09-02: qty 20

## 2023-09-02 MED ORDER — BUPIVACAINE-EPINEPHRINE (PF) 0.5% -1:200000 IJ SOLN
INTRAMUSCULAR | Status: DC | PRN
Start: 2023-09-02 — End: 2023-09-02
  Administered 2023-09-02: 15 mL via PERINEURAL

## 2023-09-02 MED ORDER — LACTATED RINGERS IV SOLN: INTRAVENOUS | Status: DC

## 2023-09-02 MED ORDER — PHENYLEPHRINE HCL-NACL 20-0.9 MG/250ML-% IV SOLN
INTRAVENOUS | Status: DC | PRN
Start: 2023-09-02 — End: 2023-09-02
  Administered 2023-09-02: 30 ug/min via INTRAVENOUS

## 2023-09-02 MED ORDER — DEXAMETHASONE SODIUM PHOSPHATE 10 MG/ML IJ SOLN
INTRAMUSCULAR | Status: AC
  Filled 2023-09-02: qty 1

## 2023-09-02 MED ORDER — LIDOCAINE HCL (CARDIAC) PF 100 MG/5ML IV SOSY
PREFILLED_SYRINGE | INTRAVENOUS | Status: DC | PRN
  Administered 2023-09-02: 100 mg via INTRAVENOUS

## 2023-09-02 MED ORDER — ACETAMINOPHEN 500 MG PO TABS
1000.0000 mg | ORAL_TABLET | Freq: Once | ORAL | Status: AC
  Administered 2023-09-02: 1000 mg via ORAL
  Filled 2023-09-02: qty 2

## 2023-09-02 MED ORDER — TIZANIDINE HCL 4 MG PO TABS: 4.0000 mg | ORAL_TABLET | Freq: Four times a day (QID) | ORAL | 0 refills | Status: AC | PRN

## 2023-09-02 MED ORDER — BUPIVACAINE LIPOSOME 1.3 % IJ SUSP
INTRAMUSCULAR | Status: DC | PRN
  Administered 2023-09-02: 10 mL via PERINEURAL

## 2023-09-02 MED ORDER — EPHEDRINE SULFATE (PRESSORS) 50 MG/ML IJ SOLN
INTRAMUSCULAR | Status: DC | PRN
Start: 2023-09-02 — End: 2023-09-02
  Administered 2023-09-02: 5 mg via INTRAVENOUS

## 2023-09-02 MED ORDER — TRANEXAMIC ACID-NACL 1000-0.7 MG/100ML-% IV SOLN
1000.0000 mg | INTRAVENOUS | Status: AC
  Administered 2023-09-02: 1000 mg via INTRAVENOUS
  Filled 2023-09-02: qty 100

## 2023-09-02 MED ORDER — OXYCODONE-ACETAMINOPHEN 5-325 MG PO TABS
1.0000 | ORAL_TABLET | ORAL | 0 refills | Status: AC | PRN
Start: 2023-09-02 — End: ?

## 2023-09-02 MED ORDER — OXYCODONE HCL 5 MG/5ML PO SOLN: 5.0000 mg | Freq: Once | ORAL | Status: DC | PRN

## 2023-09-02 MED ORDER — FENTANYL CITRATE (PF) 100 MCG/2ML IJ SOLN
INTRAMUSCULAR | Status: DC | PRN
  Administered 2023-09-02: 50 ug via INTRAVENOUS
  Administered 2023-09-02 (×2): 25 ug via INTRAVENOUS

## 2023-09-02 MED ORDER — ONDANSETRON HCL 4 MG/2ML IJ SOLN
INTRAMUSCULAR | Status: DC | PRN
  Administered 2023-09-02 (×2): 4 mg via INTRAVENOUS

## 2023-09-02 MED ORDER — SUGAMMADEX SODIUM 200 MG/2ML IV SOLN
INTRAVENOUS | Status: DC | PRN
  Administered 2023-09-02: 50 mg via INTRAVENOUS
  Administered 2023-09-02: 200 mg via INTRAVENOUS

## 2023-09-02 MED ORDER — ORAL CARE MOUTH RINSE: 15.0000 mL | Freq: Once | OROMUCOSAL | Status: AC

## 2023-09-02 MED ORDER — 0.9 % SODIUM CHLORIDE (POUR BTL) OPTIME
TOPICAL | Status: DC | PRN
  Administered 2023-09-02: 1000 mL

## 2023-09-02 MED ORDER — FENTANYL CITRATE (PF) 100 MCG/2ML IJ SOLN
INTRAMUSCULAR | Status: AC
  Filled 2023-09-02: qty 2

## 2023-09-02 MED ORDER — MIDAZOLAM HCL 5 MG/5ML IJ SOLN
INTRAMUSCULAR | Status: DC | PRN
  Administered 2023-09-02: 1 mg via INTRAVENOUS
  Administered 2023-09-02 (×2): .5 mg via INTRAVENOUS

## 2023-09-02 MED ORDER — DEXAMETHASONE SODIUM PHOSPHATE 10 MG/ML IJ SOLN
INTRAMUSCULAR | Status: DC | PRN
  Administered 2023-09-02: 10 mg via INTRAVENOUS

## 2023-09-02 MED ORDER — STERILE WATER FOR IRRIGATION IR SOLN
Status: DC | PRN
  Administered 2023-09-02: 2000 mL

## 2023-09-02 SURGICAL SUPPLY — 74 items
ADH SKN CLS APL DERMABOND .7 (GAUZE/BANDAGES/DRESSINGS) ×1
AID PSTN UNV HD RSTRNT DISP (MISCELLANEOUS) ×1
BAG COUNTER SPONGE SURGICOUNT (BAG) IMPLANT
BAG SPEC THK2 15X12 ZIP CLS (MISCELLANEOUS) ×1
BAG SPNG CNTER NS LX DISP (BAG)
BAG ZIPLOCK 12X15 (MISCELLANEOUS) ×1 IMPLANT
BIT DRILL AR 3 NS (BIT) IMPLANT
BLADE SAW SGTL 83.5X18.5 (BLADE) IMPLANT
BSPLAT GLND +2X24 MDLR (Joint) ×1 IMPLANT
COOLER ICEMAN CLASSIC (MISCELLANEOUS) IMPLANT
COVER BACK TABLE 60X90IN (DRAPES) ×1 IMPLANT
COVER SURGICAL LIGHT HANDLE (MISCELLANEOUS) ×1 IMPLANT
CUP SUT UNIV REVERS 36 NEUTRAL (Cup) IMPLANT
DERMABOND ADVANCED .7 DNX12 (GAUZE/BANDAGES/DRESSINGS) ×1 IMPLANT
DRAPE INCISE IOBAN 66X45 STRL (DRAPES) IMPLANT
DRAPE ORTHO SPLIT 77X108 STRL (DRAPES) ×2
DRAPE SHEET LG 3/4 BI-LAMINATE (DRAPES) ×1 IMPLANT
DRAPE SURG 17X11 SM STRL (DRAPES) ×1 IMPLANT
DRAPE SURG ORHT 6 SPLT 77X108 (DRAPES) ×2 IMPLANT
DRAPE U-SHAPE 47X51 STRL (DRAPES) ×1 IMPLANT
DRSG AQUACEL AG ADV 3.5X 6 (GAUZE/BANDAGES/DRESSINGS) IMPLANT
DRSG AQUACEL AG ADV 3.5X10 (GAUZE/BANDAGES/DRESSINGS) IMPLANT
DRSG TEGADERM 8X12 (GAUZE/BANDAGES/DRESSINGS) ×1 IMPLANT
DURAPREP 26ML APPLICATOR (WOUND CARE) ×1 IMPLANT
ELECT BLADE TIP CTD 4 INCH (ELECTRODE) ×1 IMPLANT
ELECT PENCIL ROCKER SW 15FT (MISCELLANEOUS) ×1 IMPLANT
ELECT REM PT RETURN 15FT ADLT (MISCELLANEOUS) ×1 IMPLANT
FACESHIELD WRAPAROUND (MASK) ×4
FACESHIELD WRAPAROUND OR TEAM (MASK) ×4 IMPLANT
GLENOID UNI REV MOD 24 +2 LAT (Joint) IMPLANT
GLENOSPHERE 36 +4 LAT/24 (Joint) IMPLANT
GLOVE BIO SURGEON STRL SZ7.5 (GLOVE) ×1 IMPLANT
GLOVE BIO SURGEON STRL SZ8 (GLOVE) ×1 IMPLANT
GLOVE SS BIOGEL STRL SZ 7 (GLOVE) ×1 IMPLANT
GLOVE SS BIOGEL STRL SZ 7.5 (GLOVE) ×1 IMPLANT
GOWN STRL SURGICAL XL XLNG (GOWN DISPOSABLE) ×2 IMPLANT
INSERT HUMERAL UNI REVERS 36 3 (Insert) IMPLANT
INSERT HUMERAL UNI REVERS 36 6 (Insert) IMPLANT
KIT BASIN OR (CUSTOM PROCEDURE TRAY) ×1 IMPLANT
KIT TURNOVER KIT A (KITS) IMPLANT
MANIFOLD NEPTUNE II (INSTRUMENTS) ×1 IMPLANT
NDL TAPERED W/ NITINOL LOOP (MISCELLANEOUS) IMPLANT
NEEDLE TAPERED W/ NITINOL LOOP (MISCELLANEOUS)
NS IRRIG 1000ML POUR BTL (IV SOLUTION) ×1 IMPLANT
PACK SHOULDER (CUSTOM PROCEDURE TRAY) ×1 IMPLANT
PAD ARMBOARD 7.5X6 YLW CONV (MISCELLANEOUS) ×1 IMPLANT
PAD COLD SHLDR WRAP-ON (PAD) IMPLANT
RESTRAINT HEAD UNIVERSAL NS (MISCELLANEOUS) ×1 IMPLANT
SCREW CENTRAL MOD 30MM (Screw) IMPLANT
SCREW PERI LOCK 5.5X16 (Screw) IMPLANT
SCREW PERI LOCK 5.5X24 (Screw) IMPLANT
SCREW PERIPHERAL 5.5X20 LOCK (Screw) IMPLANT
SCREW PERIPHERAL 5.5X28 LOCK (Screw) IMPLANT
SLING ARM FOAM STRAP LRG (SOFTGOODS) IMPLANT
SLING ARM FOAM STRAP MED (SOFTGOODS) IMPLANT
SMARTMIX MINI TOWER (MISCELLANEOUS)
SPONGE T-LAP 4X18 ~~LOC~~+RFID (SPONGE) IMPLANT
STEM HUMERAL UNIVERS SZ8 (Stem) IMPLANT
SUCTION TUBE FRAZIER 12FR DISP (SUCTIONS) ×1 IMPLANT
SUT MNCRL AB 3-0 PS2 18 (SUTURE) ×1 IMPLANT
SUT MON AB 2-0 CT1 36 (SUTURE) ×1 IMPLANT
SUT VIC AB 1 CT1 36 (SUTURE) ×1 IMPLANT
SUT VIC AB 2-0 CT1 27 (SUTURE) ×1
SUT VIC AB 2-0 CT1 TAPERPNT 27 (SUTURE) ×1 IMPLANT
SUTURE TAPE 1.3 40 TPR END (SUTURE) IMPLANT
SUTURETAPE 1.3 40 TPR END (SUTURE)
SWAB COLLECTION DEVICE MRSA (MISCELLANEOUS) IMPLANT
SWAB CULTURE ESWAB REG 1ML (MISCELLANEOUS) IMPLANT
TOWEL OR 17X26 10 PK STRL BLUE (TOWEL DISPOSABLE) ×1 IMPLANT
TOWEL OR NON WOVEN STRL DISP B (DISPOSABLE) ×1 IMPLANT
TOWER SMARTMIX MINI (MISCELLANEOUS) IMPLANT
TUBE SUCTION HIGH CAP CLEAR NV (SUCTIONS) ×1 IMPLANT
TUBING CONNECTING 10 (TUBING) ×1 IMPLANT
WATER STERILE IRR 1000ML POUR (IV SOLUTION) ×1 IMPLANT

## 2023-09-02 NOTE — Transfer of Care (Signed)
Immediate Anesthesia Transfer of Care Note  Patient: Tracey Clarke  Procedure(s) Performed: Removal Left Anatomic shoulder arthroplasty and conversion to Left Reverse shoulder arthroplasty (Left: Shoulder)  Patient Location: PACU  Anesthesia Type:General  Level of Consciousness: awake, alert , oriented, and patient cooperative  Airway & Oxygen Therapy: Patient Spontanous Breathing and Patient connected to face mask oxygen  Post-op Assessment: Report given to RN and Post -op Vital signs reviewed and stable  Post vital signs: Reviewed and stable  Last Vitals:  Vitals Value Taken Time  BP 124/65 09/02/23 1008  Temp    Pulse 82 09/02/23 1009  Resp 16 09/02/23 1009  SpO2 100 % 09/02/23 1009  Vitals shown include unfiled device data.  Last Pain:  Vitals:   09/02/23 0543  TempSrc: Oral  PainSc:          Complications: No notable events documented.

## 2023-09-02 NOTE — Op Note (Signed)
09/02/2023  9:43 AM  PATIENT:   Tracey Clarke  57 y.o. female  PRE-OPERATIVE DIAGNOSIS:  Unstable Left anatomic total shoulder arthroplasty  POST-OPERATIVE DIAGNOSIS: Same with finding of severe posterior instability  PROCEDURE: Removal of left shoulder anatomic arthroplasty and conversion to reverse shoulder arthroplasty utilizing a press-fit size 8 Arthrex stem with a neutral metathesis, +6 constrained polyethylene insert, 36/+4 glenosphere and a small/+2 baseplate.  SURGEON:  Senaida Lange M.D.  ASSISTANTS: Ralene Bathe, PA-C  Ralene Bathe, PA-C was utilized as an Geophysicist/field seismologist throughout this case, essential for help with positioning the patient, positioning extremity, tissue manipulation, implantation of the prosthesis, suture management, wound closure, and intraoperative decision-making.  ANESTHESIA:   General Endotracheal and interscalene block with Exparel  EBL: 250 cc  SPECIMEN: 3 tissue specimens were obtained: #1 anterior capsule, #2 Superior capsule, #3 periprosthetic  Drains: None   PATIENT DISPOSITION:  PACU - hemodynamically stable.    PLAN OF CARE: Discharge to home after PACU  Brief history:  Patient is a 57 year old female who underwent an index left shoulder anatomic arthroplasty approximately a year and a half ago and unfortunately developed recurrent instability.  Subsequent workup shows all inflammatory parameters to be within normal limits and it is felt that her difficulties are related to mechanical instability.  Due to her ongoing pain, instability, and functional imitation she is brought to the operating this time for planned left shoulder conversion to reverse arthroplasty.  Preoperatively, I counseled the patient regarding treatment options and risks versus benefits thereof.  Possible surgical complications were all reviewed including potential for bleeding, infection, neurovascular injury, persistent pain, loss of motion, anesthetic complication,  failure of the implant, and possible need for additional surgery. They understand and accept and agrees with our planned procedure.   Procedure detail:  After undergoing routine preop evaluation the patient received prophylactic antibiotics and interscalene block with Exparel was established in the holding area by the anesthesia department.  Subsequently placed spine on the operating table and underwent the smooth induction of a general endotracheal anesthesia.  Placed into the beachchair position and appropriately padded and protected.  The left shoulder girdle region was sterilely prepped and draped in standard fashion.  Timeout was called.  Examination under anesthesia revealed gross posterior instability of the left shoulder.  A deltopectoral approach was made through the previous incision approximately 10 cm in length.  Skin flaps were elevated dissection carried deeply and the deltopectoral interval was then identified and developed from proximal to distal and there was a vein down to be intact which we preserved and reflected laterally with the deltoid.  Adhesions were then progressively dissected beneath the deltoid and severe adhesions were encountered.  The conjoined tendon was mobilized and retracted medially.  The subscapularis was then elevated from the lesser tuberosity with the previously placed suture anchors and suture material all removed.  The margin of the subscapularis was then tagged with a pair of grasping suture tape sutures for retraction.  We then split the capsule superiorly and dissected Attachments from the medial aspect of the humeral neck and due to the severe scarring it was necessary to release the most anterior aspect of the rotator cuff to allow delivery of the humeral head through the wound.  The exposure was made more difficult by the fact that the shoulder continued to dislocate posteriorly making asks [place] that much more difficult.  Ultimately we were able to  circumferentially release adhesions about the proximal humeral metaphysis allowing delivery of the  humeral head through the wound.  We then obtained a portion of tissue from both the anterior capsule and the superior capsule which were sent for tissue culture.  We then elevated the humeral head from the trunnion and took a specimen of the tissue beneath the humeral head and sent this as our third specimen for culture.  Arcade screw was then broken free and removed and then the trunnion could be removed preserving the majority of the metaphyseal bone.  This allowed [place] access into the metaphysis where removed some retained suture material and suture anchors and then placed a metal cap over the cut proximal humeral surface we then exposed the glenoid.  We performed a capsular release allowing circumferential visualization of the retained glenoid and this was grossly loose and unstable.  It was elevated free and removed and fortunately there was very minimal bone loss from the glenoid and there were no uncontained defects.  A rondure was then used to remove retained debris and soft tissues and a guidepin was then directed into the center of the glenoid and measured for a 30 mm lag screw.  The glenoid was then prepared with the central followed by the peripheral reamer to a subchondral bony bed.  Our final baseplate was then assembled with vancomycin powder applied to the threads of the lag screw and the baseplate was then seated achieving good purchase and fixation.  All of the peripheral locking screw was then placed using standard technique with excellent fixation.  A 36/+4 glenosphere was then impacted onto the baseplate and a central locking screw was placed.  We then reexposed the proximal humeral metaphysis and the canal was then opened by hand reaming up to size 7 ultimately broached up to a size 8 stem at 20 degrees of retroversion.  Good fixation was achieved.  The metaphysis was then prepared with a neutral  metaphyseal reaming guide.  A trial implant was then placed and this overall showed good motion stability and soft tissue balance.  Trial was then removed.  The canal was irrigated cleaned and dried and vancomycin powder was sprayed liberally into the humeral canal.  Our final implant was assembled and was then impacted achieving excellent purchase and fixation.  We then performed a trial reduction and ultimately felt that a +3 poly would give [place] appropriate soft tissue balance.  We utilized initially a +3 constrained poly but upon final reduction found that this did show evidence to potentially allow posterior instability given the deficient posterior soft tissues and so the +3 constrained poly was removed and we upsized to a +6 constrained poly and this was then impacted onto the implant and final reduction was then performed and with this we found that there was excellent stability of the construct much to our satisfaction.  The wound was then copious irrigated.  Hemostasis was obtained.  We mobilized the subscapularis and confirmed good elasticity and the subscap was then repaired back to Dilusol the collar of the implant using the previously placed suture tape sutures.  The balance of the vancomycin powder was then sprayed liberally felt the deep soft tissue planes.  The deltopectoral interval was then reapproximated with a series of figure-of-eight and with Vicryl sutures.  2-0 Monocryl used to close the subcu layer and intracuticular 3-0 Monocryl used to close the skin followed by Dermabond and an Aquacel dressing.  The left arm was placed into a sling and the patient was awakened, extubated, and taken to the recovery room in stable  condition.  Senaida Lange MD   Contact # [phone]

## 2023-09-02 NOTE — H&P (Signed)
Tracey Clarke    Chief Complaint: Unstable Left total shoulder arthroplasty HPI: The patient is a 57 y.o. female status post left shoulder anatomic arthroplasty who subsequently has developed recurrent anterior instability.  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  Past Medical History:  Diagnosis Date   Adrenal insufficiency (HCC)    adrenal fatigue per pt   Anxiety    Arthritis    Fibromyalgia    GERD (gastroesophageal reflux disease)    High cholesterol    History of kidney stones    Hypertension    no meds in 10 years per pt   Migraine    Osteoporosis    PONV (postoperative nausea and vomiting)       Past Surgical History:  Procedure Laterality Date   burr hole surgery on brain      CHOLECYSTECTOMY     ENDOMETRIAL ABLATION     KNEE SURGERY     REFRACTIVE SURGERY     right wrist arthroscopic surgery      SHOULDER SURGERY     umbilical hernia surgery       History reviewed. No pertinent family history.  Social History:  reports that she has never smoked. She has never used smokeless tobacco. She reports that she does not drink alcohol and does not use drugs.  BMI: Estimated body mass index is 30.47 kg/m as calculated from the following:   Height as of this encounter: 5\' 3"  (1.6 m).   Weight as of this encounter: 78 kg.  Lab Results  Component Value Date   ALBUMIN 3.7 05/02/2021   Diabetes: Patient does not have a diagnosis of diabetes.     Smoking Status:   reports that she has never smoked. She has never used smokeless tobacco.     Medications Prior to Admission  Medication Sig Dispense Refill   acetaminophen (TYLENOL) 650 MG CR tablet Take 1,300 mg by mouth every 8 (eight) hours as needed for pain.     ARIPiprazole (ABILIFY) 5 MG tablet Take 5 mg by mouth at bedtime.     Ascorbic Acid (VITAMIN C) 1000 MG tablet Take 1,000 mg by  mouth daily.     aspirin EC 81 MG tablet Take 81 mg by mouth daily. Swallow whole.     atenolol (TENORMIN) 50 MG tablet Take 50 mg by mouth at bedtime.     Cholecalciferol (VITAMIN D3) 125 MCG (5000 UT) CAPS Take 5,000 Units by mouth daily.     Coenzyme Q10 (COQ10) 100 MG CAPS Take 100 mg by mouth daily.     diphenhydramine-acetaminophen (TYLENOL PM) 25-500 MG TABS tablet Take 2 tablets by mouth at bedtime as needed (sleep/pain).     DULoxetine (CYMBALTA) 60 MG capsule Take 120 mg by mouth at bedtime.     furosemide (LASIX) 20 MG tablet Take 20 mg by mouth daily.     gabapentin (NEURONTIN) 800 MG tablet Take 800 mg by mouth 2 (two) times daily.     hydrochlorothiazide (HYDRODIURIL) 12.5 MG tablet Take 12.5 mg by mouth daily.     hydrocortisone (CORTEF) 10 MG tablet Take 10 mg by mouth 2 (two) times daily.     LIPITOR 80 MG tablet Take 80 mg by mouth at bedtime.     LORazepam (ATIVAN) 0.5 MG tablet Take 0.5-1 mg by mouth See admin instructions. 0.5 mg in the morning, 1 mg at bedtime  meloxicam (MOBIC) 15 MG tablet Take 15 mg by mouth daily as needed for pain.     omeprazole (PRILOSEC) 40 MG capsule Take 80 mg by mouth daily.     OZEMPIC, 1 MG/DOSE, 4 MG/3ML SOPN Inject 1 mg into the skin once a week. Thursdays     QULIPTA 60 MG TABS Take 60 mg by mouth daily.     sertraline (ZOLOFT) 100 MG tablet Take 100 mg by mouth daily.     tiZANidine (ZANAFLEX) 4 MG tablet Take 4-8 mg by mouth See admin instructions. 4 mg in the afternoon, 8 mg at bedtime (or later in the evening)     traZODone (DESYREL) 50 MG tablet Take 100 mg by mouth at bedtime.     ZINC PICOLINATE PO Take 22 mg by mouth daily.     zolpidem (AMBIEN) 10 MG tablet Take 10 mg by mouth at bedtime.       Physical Exam: Inspection of the left shoulder demonstrates a well-healed deltopectoral incision.  There is no erythema, or induration, or local tenderness.  She demonstrates functional but guarded motion with fair strength to manual  muscle testing.  Positive anterior apprehension sign.  She is otherwise neurovascular intact left upper extremity.  Diagnostic imaging shows a stemless humeral head.  Laboratory workup demonstrates normal inflammatory markers.  Vitals  Temp:  [98.3 F (36.8 C)] 98.3 F (36.8 C) (10/03 0543) Pulse Rate:  [69] 69 (10/03 0543) Resp:  [18] 18 (10/03 0543) BP: (121)/(72) 121/72 (10/03 0543) SpO2:  [96 %] 96 % (10/03 0543) Weight:  [78 kg] 78 kg (10/03 0538)  Assessment/Plan  Impression: Unstable Left total shoulder arthroplasty  Plan of Action: Procedure(s): Removal Left Anatomic shoulder arthroplasty and conversion to Left Reverse shoulder arthroplasty  [place] M Larnell Granlund 09/02/2023, 6:40 AM Contact # [phone]

## 2023-09-02 NOTE — Evaluation (Signed)
Occupational Therapy Evaluation Patient Details Name: Tracey Clarke MRN: [id number] DOB: 09-27-1966 Today's Date: 09/02/2023   History of Present Illness Tracey Clarke is a 57 yr old female who is s/p a L TSA on 09-02-23, due to unstable L anatomic total shoulder arthroplasty.   Clinical Impression   Pt is s/p shoulder replacement of left non-dominant upper extremity on 09-02-23. Therapist provided education and instruction to patient and spouse with regards to ROM/exercises, post-op precautions, UE positioning, donning upper extremity clothing, recommendations for bathing while maintaining shoulder precautions, use of ice for pain and edema management, use of ice machine, and how to correctly donn/doff sling. Patient and spouse verbalized and demonstrated understanding as needed. Patient needed assistance to donn shirt, underwear, pants, and shoes with instruction provided on compensatory strategies to perform ADLs. Patient to follow up with MD for further therapy needs.         If plan is discharge home, recommend the following: A little help with bathing/dressing/bathroom;Assistance with cooking/housework;Assist for transportation;Help with stairs or ramp for entrance    Functional Status Assessment  Patient has had a recent decline in their functional status and demonstrates the ability to make significant improvements in function in a reasonable and predictable amount of time.  Equipment Recommendations  None recommended by OT    Recommendations for Other Services       Precautions / Restrictions Precautions Precautions: Shoulder Type of Shoulder Precautions: If sitting in controlled environment, ok to come out of sling to give neck a break. Please sleep in it to protect until follow up in office.     OK to use operative arm for feeding, hygiene and ADLs.   Ok to instruct Pendulums and lap slides as exercises. Ok to use operative arm within the following parameters for ADL purposes      New ROM (8/18)   Ok for PROM, AAROM, AROM within pain tolerance and within the following ROM   ER 20   ABD 45   FE 60 Shoulder Interventions: Shoulder sling/immobilizer Precaution Booklet Issued: Yes (comment) Required Braces or Orthoses: Sling Restrictions Weight Bearing Restrictions: Yes LUE Weight Bearing: Non weight bearing Other Position/Activity Restrictions: Okay to perform L UE elbow, wrist, and hand ROM      Mobility Bed Mobility    General bed mobility comments: pt was received seated in chair    Transfers Overall transfer level: Needs assistance   Transfers: Sit to/from Stand Sit to Stand: Supervision                  Balance Overall balance assessment: No apparent balance deficits (not formally assessed)            ADL either performed or assessed with clinical judgement       Pertinent Vitals/Pain Pain Assessment Pain Assessment: 0-10 Pain Score: 3  Pain Location: L shoulder Pain Intervention(s): Limited activity within patient's tolerance, Monitored during session        Communication Communication Communication: No apparent difficulties   Cognition Arousal: Alert Behavior During Therapy: WFL for tasks assessed/performed Overall Cognitive Status: Within Functional Limits for tasks assessed          General Comments: Oriented x4, able to follow commands without difficulty, friendly           Shoulder Instructions Shoulder Instructions Donning/doffing shirt without moving shoulder: Minimal assistance (with pt's spouse performing teach back) Method for sponge bathing under operated UE: Patient able to independently direct caregiver Donning/doffing sling/immobilizer: Minimal assistance (with pt's  spouse performing teach back) Correct positioning of sling/immobilizer: Minimal assistance (with pt's spouse performing teach back) Pendulum exercises (written home exercise program): Supervision/safety ROM for elbow, wrist and digits of  operated UE: Caregiver independent with task;Patient able to independently direct caregiver Sling wearing schedule (on at all times/off for ADL's): Patient able to independently direct caregiver Proper positioning of operated UE when showering: Patient able to independently direct caregiver;Caregiver independent with task Dressing change:  (N/A) Positioning of UE while sleeping: Patient able to independently direct caregiver;Caregiver independent with task    Home Living Family/patient expects to be discharged to:: Private residence Living Arrangements: Spouse/significant other Available Help at Discharge: Family Type of Home: House Home Access: Stairs to enter Secretary/administrator of Steps: 3 Entrance Stairs-Rails: None Home Layout: One level     Bathroom Shower/Tub: Tub/shower unit         Home Equipment: Tub bench;BSC/3in1;Cane - single point;Hand held shower head   Additional Comments: unable to recall if walker is standard or rolling      Prior Functioning/Environment Prior Level of Function : Independent/Modified Independent;Driving             Mobility Comments: She did not use an assistive device for ambulation.          OT Problem List: Impaired UE functional use      OT Treatment/Interventions:   No further acute care treatment needs identified       OT Frequency:  N/A       AM-PAC OT "6 Clicks" Daily Activity     Outcome Measure Help from another person eating meals?: None Help from another person taking care of personal grooming?: None Help from another person toileting, which includes using toliet, bedpan, or urinal?: A Little Help from another person bathing (including washing, rinsing, drying)?: A Little Help from another person to put on and taking off regular upper body clothing?: A Little Help from another person to put on and taking off regular lower body clothing?: A Little 6 Click Score: 20   End of Session Equipment Utilized During  Treatment:  (N/A) Nurse Communication: Other (comment) (shoulder education completed)  Activity Tolerance: Patient tolerated treatment well Patient left: in chair;with call bell/phone within reach;with family/visitor present  OT Visit Diagnosis: Muscle weakness (generalized) (M62.81)                Time: 1610-9604 OT Time Calculation (min): 31 min Charges:  OT General Charges $OT Visit: 1 Visit OT Evaluation $OT Eval Low Complexity: 1 Low OT Treatments $Self Care/Home Management : 8-22 mins   Reuben Likes, OTR/L 09/02/2023, 4:27 PM

## 2023-09-02 NOTE — Anesthesia Procedure Notes (Signed)
Anesthesia Regional Block: Interscalene brachial plexus block   Pre-Anesthetic Checklist: , timeout performed,  Correct Patient, Correct Site, Correct Laterality,  Correct Procedure, Correct Position, site marked,  Risks and benefits discussed,  Pre-op evaluation,  At surgeon's request and post-op pain management  Laterality: Left  Prep: Maximum Sterile Barrier Precautions used, chloraprep       Needles:  Injection technique: Single-shot  Needle Type: Echogenic Stimulator Needle     Needle Length: 4cm  Needle Gauge: 22     Additional Needles:   Procedures:,,,, ultrasound used (permanent image in chart),,    Narrative:  Start time: 09/02/2023 7:10 AM End time: 09/02/2023 7:13 AM Injection made incrementally with aspirations every 5 mL.  Performed by: Personally  Anesthesiologist: Kaylyn Layer, MD  Additional Notes: Risks, benefits, and alternative discussed. Patient gave consent for procedure. Patient prepped and draped in sterile fashion. Sedation administered, patient remains easily responsive to voice. Relevant anatomy identified with ultrasound guidance. Local anesthetic given in 5cc increments with no signs or symptoms of intravascular injection. No pain or paraesthesias with injection. Patient monitored throughout procedure with signs of LAST or immediate complications. Tolerated well. Ultrasound image placed in chart.  Amalia Greenhouse, MD

## 2023-09-02 NOTE — Anesthesia Postprocedure Evaluation (Signed)
Anesthesia Post Note  Patient: Tracey Clarke  Procedure(s) Performed: Removal Left Anatomic shoulder arthroplasty and conversion to Left Reverse shoulder arthroplasty (Left: Shoulder)     Patient location during evaluation: PACU Anesthesia Type: General Level of consciousness: awake and alert Pain management: pain level controlled Vital Signs Assessment: post-procedure vital signs reviewed and stable Respiratory status: spontaneous breathing, nonlabored ventilation and respiratory function stable Cardiovascular status: blood pressure returned to baseline Postop Assessment: no apparent nausea or vomiting Anesthetic complications: no   No notable events documented.  Last Vitals:  Vitals:   09/02/23 1030 09/02/23 1100  BP: 129/73 133/79  Pulse: 77 73  Resp: 20   Temp:  36.6 C  SpO2: 100% 97%    Last Pain:  Vitals:   09/02/23 1030  TempSrc:   PainSc: 0-No pain                 Shanda Howells

## 2023-09-02 NOTE — Anesthesia Procedure Notes (Signed)
Procedure Name: Intubation Date/Time: 09/02/2023 7:44 AM  Performed by: Garth Bigness, CRNAPre-anesthesia Checklist: Patient identified, Emergency Drugs available, Suction available and Patient being monitored Patient Re-evaluated:Patient Re-evaluated prior to induction Oxygen Delivery Method: Circle system utilized Preoxygenation: Pre-oxygenation with 100% oxygen Induction Type: IV induction Ventilation: Mask ventilation without difficulty Laryngoscope Size: Mac and 3 Grade View: Grade II Tube type: Oral Tube size: 7.0 mm Number of attempts: 1 Airway Equipment and Method: Stylet and Oral airway Placement Confirmation: ETT inserted through vocal cords under direct vision, positive ETCO2 and breath sounds checked- equal and bilateral Secured at: 22 cm Tube secured with: Tape Dental Injury: Teeth and Oropharynx as per pre-operative assessment

## 2023-09-02 NOTE — Discharge Instructions (Signed)
? ?Tracey Clarke, M.D., F.A.A.O.S. ?Orthopaedic Surgery ?Specializing in Arthroscopic and Reconstructive ?Surgery of the Shoulder ?[phone] ?[address] - [place],  [address] - Fax [phone] ? ? ?POST-OP TOTAL SHOULDER REPLACEMENT INSTRUCTIONS ? ?1. Follow up in the office for your first post-op appointment 10-14 days from the date of your surgery. If you do not already have a scheduled appointment, our office will contact you to schedule. ? ?2. The bandage over your incision is waterproof. You may begin showering with this dressing on. You may leave this dressing on until first follow up appointment within 2 weeks. We prefer you leave this dressing in place until follow up however after 5-7 days if you are having itching or skin irritation and would like to remove it you may do so. Go slow and tug at the borders gently to break the bond the dressing has with the skin. At this point if there is no drainage it is okay to go without a bandage or you may cover it with a light guaze and tape. You can also expect significant bruising around your shoulder that will drift down your arm and into your chest wall. This is very normal and should resolve over several days. ? ? 3. Wear your sling/immobilizer at all times except to perform the exercises below or to occasionally let your arm dangle by your side to stretch your elbow. You also need to sleep in your sling immobilizer until instructed otherwise. It is ok to remove your sling if you are sitting in a controlled environment and allow your arm to rest in a position of comfort by your side or on your lap with pillows to give your neck and skin a break from the sling. You may remove it to allow arm to dangle by side to shower. If you are up walking around and when you go to sleep at night you need to wear it. ? ?4. Range of motion to your elbow, wrist, and hand are encouraged 3-5 times daily. Exercise to your hand and fingers helps to reduce  swelling you may experience. ? ? ?5. Prescriptions for a pain medication and a muscle relaxant are provided for you. It is recommended that if you are experiencing pain that you pain medication alone is not controlling, add the muscle relaxant along with the pain medication which can give additional pain relief. The first 1-2 days is generally the most severe of your pain and then should gradually decrease. As your pain lessens it is recommended that you decrease your use of the pain medications to an "as needed basis'" only and to always comply with the recommended dosages of the pain medications. ? ?6. Pain medications can produce constipation along with their use. If you experience this, the use of an over the counter stool softener or laxative daily is recommended.  ? ?7. For additional questions or concerns, please do not hesitate to call the office. If after hours there is an answering service to forward your concerns to the physician on call. ? ?8.Pain control following an exparel block ? ?To help control your post-operative pain you received a nerve block  performed with Exparel which is a long acting anesthetic (numbing agent) which can provide pain relief and sensations of numbness (and relief of pain) in the operative shoulder and arm for up to 3 days. Sometimes it provides mixed relief, meaning you may still have numbness in certain areas of the arm but can still be able to   move  parts of that arm, hand, and fingers. We recommend that your prescribed pain medications  be used as needed. We do not feel it is necessary to "pre medicate" and "stay ahead" of pain.  Taking narcotic pain medications when you are not having any pain can lead to unnecessary and potentially dangerous side effects.   ? ?9. Use the ice machine as much as possible in the first 5-7 days from surgery, then you can wean its use to as needed. The ice typically needs to be replaced every 6 hours, instead of ice you can actually freeze  water bottles to put in the cooler and then fill water around them to avoid having to purchase ice. You can have spare water bottles freezing to allow you to rotate them once they have melted. Try to have a thin shirt or light cloth or towel under the ice wrap to protect your skin.  ? ?FOR ADDITIONAL INFO ON ICE MACHINE AND INSTRUCTIONS GO TO THE WEBSITE AT ? ?https://www.[employer].com/products/donjoy/donjoy-iceman-classic3 ? ?10.  We recommend that you avoid any dental work or cleaning in the first 3 months following your joint replacement. This is to help minimize the possibility of infection from the bacteria in your mouth that enters your bloodstream during dental work. We also recommend that you take an antibiotic prior to your dental work for the first year after your shoulder replacement to further help reduce that risk. Please simply contact our office for antibiotics to be sent to your pharmacy prior to dental work. ? ?11. Dental Antibiotics: ? ?We recommend waiting at least 3 months for any dental work even cleanings unless there is a dental emergency. We also recommend  prophylactic antibiotics for all dental procdeures  the first year following your joint replacement. In some exceptions we recommend them to be used lifelong. We will provide you with that prescription in follow up office visits, or you can call our office. ? ?Exceptions are as follows: ? ?1. History of prior total joint infection ? ?2. Severely immunocompromised (Organ Transplant, cancer chemotherapy, Rheumatoid biologic ?meds such as Humera) ? ?3. Poorly controlled diabetes (A1C &gt; 8.0, blood glucose over 200) ? ? ?POST-OP EXERCISES ? ?Pendulum Exercises ? ?Perform pendulum exercises while standing and bending at the waist. Support your uninvolved arm on a table or chair and allow your operated arm to hang freely. Make sure to do these exercises passively - not using you shoulder muscles. These exercises can be performed once your  nerve block effects have worn off. ? ?Repeat 20 times. Do 3 sessions per day. ? ? ?  ?

## 2023-09-03 ENCOUNTER — Encounter (HOSPITAL_COMMUNITY): Payer: Self-pay | Admitting: Orthopedic Surgery

## 2023-09-23 LAB — AEROBIC/ANAEROBIC CULTURE W GRAM STAIN (SURGICAL/DEEP WOUND)
Culture: NO GROWTH
Culture: NO GROWTH
Culture: NO GROWTH
Gram Stain: NONE SEEN
Gram Stain: NONE SEEN
Gram Stain: NONE SEEN

## 2024-02-23 ENCOUNTER — Other Ambulatory Visit: Payer: Self-pay | Admitting: Orthopedic Surgery

## 2024-02-23 ENCOUNTER — Other Ambulatory Visit (HOSPITAL_COMMUNITY): Payer: Self-pay | Admitting: Orthopedic Surgery

## 2024-02-23 DIAGNOSIS — Z96612 Presence of left artificial shoulder joint: Secondary | ICD-10-CM

## 2024-02-24 ENCOUNTER — Ambulatory Visit (HOSPITAL_COMMUNITY)
Admission: RE | Admit: 2024-02-24 | Discharge: 2024-02-24 | Disposition: A | Discharge status: Home / Self Care | Source: Ambulatory Visit | Attending: Orthopedic Surgery | Admitting: Orthopedic Surgery

## 2024-02-24 DIAGNOSIS — Z96612 Presence of left artificial shoulder joint: Secondary | ICD-10-CM | POA: Diagnosis present

## 2024-02-24 MED ORDER — TECHNETIUM TC 99M MEDRONATE IV KIT
20.0000 | PACK | Freq: Once | INTRAVENOUS | Status: AC | PRN
  Administered 2024-02-24: 19.7 via INTRAVENOUS

## 2024-03-02 ENCOUNTER — Ambulatory Visit
Admission: RE | Admit: 2024-03-02 | Discharge: 2024-03-02 | Disposition: A | Discharge status: Home / Self Care | Payer: Self-pay | Source: Ambulatory Visit | Attending: Orthopedic Surgery | Admitting: Orthopedic Surgery

## 2024-03-02 DIAGNOSIS — Z96612 Presence of left artificial shoulder joint: Secondary | ICD-10-CM
